# Patient Record
Sex: Female | Born: 1945 | Race: White | Hispanic: No | Marital: Married | Smoking: Former smoker
Health system: Southern US, Community
[De-identification: ages and names within clinical notes are randomized; demographics above are authoritative.]

## PROBLEM LIST (undated history)

## (undated) DIAGNOSIS — H269 Unspecified cataract: Secondary | ICD-10-CM

## (undated) DIAGNOSIS — M1990 Unspecified osteoarthritis, unspecified site: Secondary | ICD-10-CM

## (undated) HISTORY — DX: Unspecified osteoarthritis, unspecified site: M19.90

## (undated) HISTORY — PX: CATARACT EXTRACTION: SUR2

## (undated) HISTORY — PX: TUBAL LIGATION: SHX77

## (undated) HISTORY — PX: COLONOSCOPY: SHX174

## (undated) HISTORY — DX: Unspecified cataract: H26.9

---

## 1983-07-15 HISTORY — PX: COLECTOMY: SHX59

## 1983-07-15 HISTORY — PX: OTHER SURGICAL HISTORY: SHX169

## 1990-07-14 HISTORY — PX: VAGINAL HYSTERECTOMY: SUR661

## 1997-07-14 HISTORY — PX: CHOLECYSTECTOMY: SHX55

## 2003-09-20 ENCOUNTER — Encounter: Admission: RE | Admit: 2003-09-20 | Discharge: 2003-09-20 | Payer: Self-pay | Admitting: Family Medicine

## 2004-06-04 ENCOUNTER — Ambulatory Visit: Payer: Self-pay | Admitting: Family Medicine

## 2004-07-14 HISTORY — PX: HAND SURGERY: SHX662

## 2004-09-05 ENCOUNTER — Ambulatory Visit: Payer: Self-pay | Admitting: Family Medicine

## 2004-09-26 ENCOUNTER — Ambulatory Visit: Payer: Self-pay | Admitting: Family Medicine

## 2004-10-07 ENCOUNTER — Ambulatory Visit: Payer: Self-pay | Admitting: Family Medicine

## 2004-10-15 ENCOUNTER — Encounter: Admission: RE | Admit: 2004-10-15 | Discharge: 2004-10-15 | Payer: Self-pay | Admitting: Family Medicine

## 2004-11-12 ENCOUNTER — Ambulatory Visit: Payer: Self-pay | Admitting: Gastroenterology

## 2004-11-27 ENCOUNTER — Ambulatory Visit: Payer: Self-pay | Admitting: Gastroenterology

## 2005-01-28 ENCOUNTER — Encounter: Admission: RE | Admit: 2005-01-28 | Discharge: 2005-01-28 | Payer: Self-pay | Admitting: Family Medicine

## 2005-05-12 ENCOUNTER — Ambulatory Visit: Payer: Self-pay | Admitting: Family Medicine

## 2005-07-16 ENCOUNTER — Ambulatory Visit: Payer: Self-pay | Admitting: Family Medicine

## 2005-08-26 ENCOUNTER — Ambulatory Visit: Payer: Self-pay | Admitting: Family Medicine

## 2005-11-03 ENCOUNTER — Encounter: Admission: RE | Admit: 2005-11-03 | Discharge: 2005-11-03 | Payer: Self-pay | Admitting: Family Medicine

## 2005-11-04 ENCOUNTER — Ambulatory Visit: Payer: Self-pay | Admitting: Family Medicine

## 2005-11-11 ENCOUNTER — Ambulatory Visit: Payer: Self-pay | Admitting: Family Medicine

## 2005-12-19 ENCOUNTER — Ambulatory Visit: Payer: Self-pay | Admitting: Family Medicine

## 2006-05-07 ENCOUNTER — Ambulatory Visit: Payer: Self-pay | Admitting: Family Medicine

## 2006-10-26 ENCOUNTER — Ambulatory Visit: Payer: Self-pay | Admitting: Family Medicine

## 2006-11-03 ENCOUNTER — Ambulatory Visit: Payer: Self-pay | Admitting: Family Medicine

## 2006-11-05 ENCOUNTER — Encounter: Admission: RE | Admit: 2006-11-05 | Discharge: 2006-11-05 | Payer: Self-pay | Admitting: Family Medicine

## 2006-11-19 ENCOUNTER — Ambulatory Visit: Payer: Self-pay | Admitting: Family Medicine

## 2006-11-19 LAB — CONVERTED CEMR LAB
ALT: 24 units/L (ref 0–40)
AST: 25 units/L (ref 0–37)
Albumin: 3.9 g/dL (ref 3.5–5.2)
Basophils Absolute: 0 10*3/uL (ref 0.0–0.1)
Calcium: 9.6 mg/dL (ref 8.4–10.5)
Chloride: 111 meq/L (ref 96–112)
Cholesterol: 191 mg/dL (ref 0–200)
Eosinophils Absolute: 0.2 10*3/uL (ref 0.0–0.6)
GFR calc non Af Amer: 60 mL/min
MCHC: 35 g/dL (ref 30.0–36.0)
MCV: 88.8 fL (ref 78.0–100.0)
Platelets: 144 10*3/uL — ABNORMAL LOW (ref 150–400)
RBC: 4.57 M/uL (ref 3.87–5.11)
TSH: 2.45 microintl units/mL (ref 0.35–5.50)
Total CHOL/HDL Ratio: 3.7
Triglycerides: 127 mg/dL (ref 0–149)

## 2006-11-26 ENCOUNTER — Ambulatory Visit: Payer: Self-pay | Admitting: Family Medicine

## 2006-12-10 ENCOUNTER — Ambulatory Visit: Payer: Self-pay | Admitting: *Deleted

## 2007-01-04 DIAGNOSIS — N63 Unspecified lump in unspecified breast: Secondary | ICD-10-CM | POA: Insufficient documentation

## 2007-01-04 DIAGNOSIS — Z8719 Personal history of other diseases of the digestive system: Secondary | ICD-10-CM | POA: Insufficient documentation

## 2007-01-04 DIAGNOSIS — J309 Allergic rhinitis, unspecified: Secondary | ICD-10-CM | POA: Insufficient documentation

## 2007-01-04 DIAGNOSIS — Z9189 Other specified personal risk factors, not elsewhere classified: Secondary | ICD-10-CM | POA: Insufficient documentation

## 2007-01-04 DIAGNOSIS — K219 Gastro-esophageal reflux disease without esophagitis: Secondary | ICD-10-CM | POA: Insufficient documentation

## 2007-04-23 ENCOUNTER — Ambulatory Visit: Payer: Self-pay | Admitting: Family Medicine

## 2007-11-15 ENCOUNTER — Ambulatory Visit: Payer: Self-pay | Admitting: Family Medicine

## 2007-11-15 DIAGNOSIS — E785 Hyperlipidemia, unspecified: Secondary | ICD-10-CM | POA: Insufficient documentation

## 2007-11-15 DIAGNOSIS — L259 Unspecified contact dermatitis, unspecified cause: Secondary | ICD-10-CM | POA: Insufficient documentation

## 2007-11-18 ENCOUNTER — Encounter: Admission: RE | Admit: 2007-11-18 | Discharge: 2007-11-18 | Payer: Self-pay | Admitting: Family Medicine

## 2007-12-13 ENCOUNTER — Ambulatory Visit: Payer: Self-pay | Admitting: Family Medicine

## 2007-12-14 LAB — CONVERTED CEMR LAB
AST: 19 units/L (ref 0–37)
Alkaline Phosphatase: 70 units/L (ref 39–117)
BUN: 17 mg/dL (ref 6–23)
Bilirubin, Direct: 0.1 mg/dL (ref 0.0–0.3)
Chloride: 107 meq/L (ref 96–112)
Eosinophils Absolute: 0.2 10*3/uL (ref 0.0–0.7)
Eosinophils Relative: 3.4 % (ref 0.0–5.0)
GFR calc non Af Amer: 67 mL/min
HDL: 43.8 mg/dL (ref >39.0)
MCV: 91.6 fL (ref 78.0–100.0)
Neutrophils Relative %: 47.2 % (ref 43.0–77.0)
Platelets: 153 10*3/uL (ref 150–400)
Potassium: 4 meq/L (ref 3.5–5.1)
Sodium: 143 meq/L (ref 135–145)
Total Bilirubin: 0.9 mg/dL (ref 0.3–1.2)
Total CHOL/HDL Ratio: 4.5
VLDL: 16 mg/dL (ref 0–40)
WBC: 6.2 10*3/uL (ref 4.5–10.5)

## 2007-12-20 ENCOUNTER — Ambulatory Visit: Payer: Self-pay | Admitting: Family Medicine

## 2007-12-27 ENCOUNTER — Encounter: Payer: Self-pay | Admitting: Family Medicine

## 2007-12-27 ENCOUNTER — Ambulatory Visit: Payer: Self-pay | Admitting: Internal Medicine

## 2008-02-08 ENCOUNTER — Ambulatory Visit: Payer: Self-pay | Admitting: Family Medicine

## 2008-03-17 ENCOUNTER — Ambulatory Visit: Payer: Self-pay | Admitting: Family Medicine

## 2008-03-17 DIAGNOSIS — M25579 Pain in unspecified ankle and joints of unspecified foot: Secondary | ICD-10-CM | POA: Insufficient documentation

## 2008-04-13 ENCOUNTER — Ambulatory Visit: Payer: Self-pay | Admitting: Family Medicine

## 2008-05-10 ENCOUNTER — Ambulatory Visit: Payer: Self-pay | Admitting: Family Medicine

## 2008-05-10 DIAGNOSIS — J209 Acute bronchitis, unspecified: Secondary | ICD-10-CM | POA: Insufficient documentation

## 2008-12-27 ENCOUNTER — Encounter: Admission: RE | Admit: 2008-12-27 | Discharge: 2008-12-27 | Payer: Self-pay | Admitting: Family Medicine

## 2009-07-14 HISTORY — PX: BACK SURGERY: SHX140

## 2009-11-21 ENCOUNTER — Ambulatory Visit (HOSPITAL_COMMUNITY): Admission: RE | Admit: 2009-11-21 | Discharge: 2009-11-22 | Payer: Self-pay | Admitting: Neurological Surgery

## 2010-01-08 ENCOUNTER — Encounter: Admission: RE | Admit: 2010-01-08 | Discharge: 2010-01-08 | Payer: Self-pay | Admitting: Family Medicine

## 2010-10-01 LAB — DIFFERENTIAL
Basophils Absolute: 0 10*3/uL (ref 0.0–0.1)
Eosinophils Absolute: 0 10*3/uL (ref 0.0–0.7)
Eosinophils Relative: 0 % (ref 0–5)
Monocytes Absolute: 0.7 10*3/uL (ref 0.1–1.0)

## 2010-10-01 LAB — CBC
HCT: 43.9 % (ref 36.0–46.0)
Hemoglobin: 15.1 g/dL — ABNORMAL HIGH (ref 12.0–15.0)
MCV: 92.6 fL (ref 78.0–100.0)
Platelets: 181 10*3/uL (ref 150–400)
RDW: 13.5 % (ref 11.5–15.5)

## 2010-10-01 LAB — BASIC METABOLIC PANEL
BUN: 14 mg/dL (ref 6–23)
CO2: 27 mEq/L (ref 19–32)
Chloride: 105 mEq/L (ref 96–112)
Glucose, Bld: 91 mg/dL (ref 70–99)
Potassium: 4 mEq/L (ref 3.5–5.1)

## 2010-10-01 LAB — PROTIME-INR: Prothrombin Time: 13.1 seconds (ref 11.6–15.2)

## 2010-10-01 LAB — SURGICAL PCR SCREEN: MRSA, PCR: NEGATIVE

## 2010-11-26 NOTE — Assessment & Plan Note (Signed)
[facility] OFFICE NOTE   April Vance, April Vance                      MRN:          [id number]  DATE:11/26/2006                            DOB:          [birth year]    This is a 65 year old woman here for a complete physical examination.  She has one main complaint today, and that is a frequent dry cough which  is been bothering her for about two years.  She often chalked it up to  allergies in the past, but has tried [employer] and now zero [employer]-  counter with no improvement.  She does have heartburn, occasionally,  with no difficulty swallowing.  She is never smoked or been around  small.  Interestingly, she does have a strong family history of GE  reflux disease, including both of her parents and a number of her  siblings.  Otherwise, she is doing well.  She had a normal mammogram  last month.  She had a normal colonoscopy in may 2006.   For further details of her past medical history, family history, social  history, habits, etcetera -- refer to her last fiscal note dated Nov 11, 2005.   ALLERGIES:  None.   CURRENT MEDICATIONS:  Multivitamins daily, Zyrtec once a day as needed  and Lasix.  Once a day as needed for her lower extremity edema.   OBJECTIVE:  Height 5 feet 8 inches.  Weight 184, BP one or 2/70, pulse  72 and regular.  In general she appears to be doing well.  Skin is clear.  Eyes clear.  Oropharynx clear.  NECK:  Supple without lymphadenopathy or masses.  LUNGS:  Clear.  CARDIAC:  Rate and rhythm regular without gallops, murmurs, rubs.  Distal pulses are full.  EKG is within normal limits.  BREASTS AND AXILLA:  Clear.  ABDOMEN:  Soft, normal bowel sounds, nontender, no masses.  External genitalia within normal limits.  RECTAL EXAM:  No masses or tenderness.  Stool Hemoccult-negative.  EXTREMITIES:  No clubbing, cyanosis, or edema.  NEUROLOGIC EXAM:  Grossly intact.  She was here for  fasting labs on May  8 -- these were all within normal limits with the exception of her lipid  panel.  HDL was excellent at 51, LDL was slightly elevated at 114, but  this is much improved from 148 a year ago.   ASSESSMENT AND PLAN:  1. Complete physical.  I talked to her about getting regular exercise.  2. Allergies stable with over-the-counter agents.  3. Lower extremity edema, stable.  4. Chronic cough.  I think that this may be due to acid reflux as      below, but we will set her up for a PA and lateral chest x-ray      soon.  5. Gastroesophageal reflux disease.  She will try Prilosec over-the-      counter once daily and      follow up if this is not successful.  6. Hyperlipidemia.  She will continue to watch her diet.     Tera Mater. Clent Ridges, MD  SAF/MedQ  DD: 11/26/2006  DT: 11/26/2006  Job #: [id number]

## 2010-12-04 ENCOUNTER — Other Ambulatory Visit: Payer: Self-pay | Admitting: Family Medicine

## 2010-12-04 DIAGNOSIS — Z1231 Encounter for screening mammogram for malignant neoplasm of breast: Secondary | ICD-10-CM

## 2011-01-21 ENCOUNTER — Ambulatory Visit: Payer: Self-pay

## 2011-01-23 ENCOUNTER — Ambulatory Visit
Admission: RE | Admit: 2011-01-23 | Discharge: 2011-01-23 | Disposition: A | Discharge status: Home / Self Care | Payer: 59 | Source: Ambulatory Visit | Attending: Family Medicine | Admitting: Family Medicine

## 2011-01-23 DIAGNOSIS — Z1231 Encounter for screening mammogram for malignant neoplasm of breast: Secondary | ICD-10-CM

## 2011-06-26 IMAGING — CR DG LUMBAR SPINE 1V
1 series · 1 of 1 positions shown · non-contrast
Comparison: None

CLINICAL DATA: Right L5-S1 microdiskectomy

LUMBAR SPINE - 1 VIEW

[view not recorded]
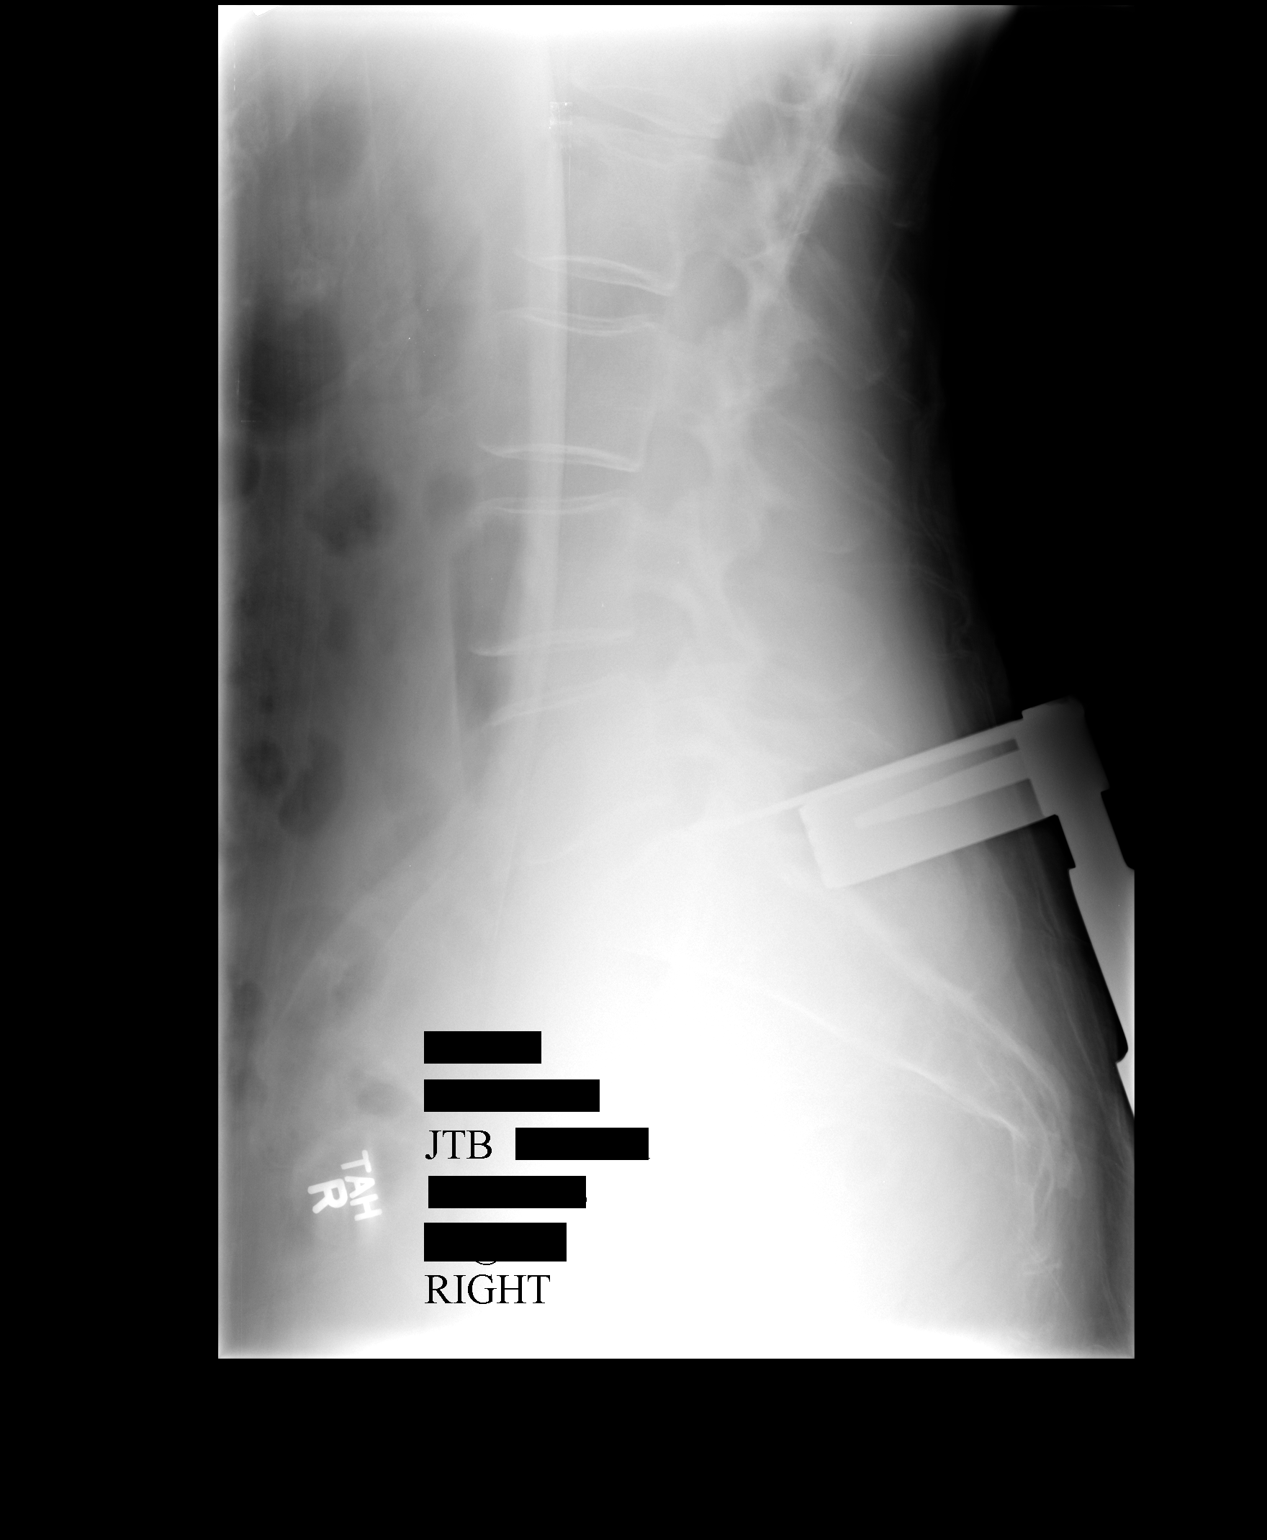

[1 of 1 positions shown; findings below may reference images not displayed]

FINDINGS: The single view shows tissue spreaders posteriorly with a
probe directed at the L5-S1 disc level.
IMPRESSION: L5-S1 localized.

## 2012-03-22 ENCOUNTER — Other Ambulatory Visit: Payer: Self-pay | Admitting: Family Medicine

## 2012-03-22 DIAGNOSIS — Z1231 Encounter for screening mammogram for malignant neoplasm of breast: Secondary | ICD-10-CM

## 2012-04-07 ENCOUNTER — Ambulatory Visit
Admission: RE | Admit: 2012-04-07 | Discharge: 2012-04-07 | Disposition: A | Discharge status: Home / Self Care | Payer: 59 | Source: Ambulatory Visit | Attending: Family Medicine | Admitting: Family Medicine

## 2012-04-07 DIAGNOSIS — Z1231 Encounter for screening mammogram for malignant neoplasm of breast: Secondary | ICD-10-CM

## 2013-03-16 ENCOUNTER — Other Ambulatory Visit: Payer: Self-pay

## 2013-03-16 DIAGNOSIS — Z1231 Encounter for screening mammogram for malignant neoplasm of breast: Secondary | ICD-10-CM

## 2013-04-12 ENCOUNTER — Ambulatory Visit: Payer: 59

## 2013-04-12 ENCOUNTER — Ambulatory Visit
Admission: RE | Admit: 2013-04-12 | Discharge: 2013-04-12 | Disposition: A | Discharge status: Home / Self Care | Payer: 59 | Source: Ambulatory Visit

## 2013-04-12 DIAGNOSIS — Z1231 Encounter for screening mammogram for malignant neoplasm of breast: Secondary | ICD-10-CM

## 2013-05-04 ENCOUNTER — Ambulatory Visit
Admission: RE | Admit: 2013-05-04 | Discharge: 2013-05-04 | Disposition: A | Discharge status: Home / Self Care | Payer: Medicare Other | Source: Ambulatory Visit | Attending: Family Medicine | Admitting: Family Medicine

## 2013-05-04 ENCOUNTER — Other Ambulatory Visit: Payer: Self-pay | Admitting: Family Medicine

## 2013-05-04 DIAGNOSIS — R0781 Pleurodynia: Secondary | ICD-10-CM

## 2014-03-16 ENCOUNTER — Other Ambulatory Visit: Payer: Self-pay

## 2014-03-16 DIAGNOSIS — Z1231 Encounter for screening mammogram for malignant neoplasm of breast: Secondary | ICD-10-CM

## 2014-04-17 ENCOUNTER — Ambulatory Visit
Admission: RE | Admit: 2014-04-17 | Discharge: 2014-04-17 | Disposition: A | Discharge status: Home / Self Care | Payer: Medicare Other | Source: Ambulatory Visit

## 2014-04-17 DIAGNOSIS — Z1231 Encounter for screening mammogram for malignant neoplasm of breast: Secondary | ICD-10-CM

## 2014-05-08 ENCOUNTER — Ambulatory Visit
Admission: RE | Admit: 2014-05-08 | Discharge: 2014-05-08 | Disposition: A | Discharge status: Home / Self Care | Payer: Medicare Other | Source: Ambulatory Visit | Attending: Family Medicine | Admitting: Family Medicine

## 2014-05-08 ENCOUNTER — Other Ambulatory Visit: Payer: Self-pay | Admitting: Family Medicine

## 2014-05-08 DIAGNOSIS — J209 Acute bronchitis, unspecified: Secondary | ICD-10-CM

## 2014-09-14 ENCOUNTER — Encounter: Payer: Self-pay | Admitting: Gastroenterology

## 2014-10-24 ENCOUNTER — Encounter: Payer: Self-pay | Admitting: Internal Medicine

## 2014-12-06 ENCOUNTER — Ambulatory Visit (AMBULATORY_SURGERY_CENTER): Payer: Self-pay

## 2014-12-06 VITALS — Ht 68.0 in | Wt 175.0 lb

## 2014-12-06 DIAGNOSIS — Z8371 Family history of colonic polyps: Secondary | ICD-10-CM

## 2014-12-06 DIAGNOSIS — Z83719 Family history of colon polyps, unspecified: Secondary | ICD-10-CM

## 2014-12-06 NOTE — Progress Notes (Signed)
No allergies to eggs or soy No home oxygen No diet/weight loss meds No past problems with anesthesia X PONV with general  Has email  Emmi instructions given for colonoscopy

## 2014-12-07 IMAGING — CR DG RIBS 2V*R*
2 series · 2 of 2 positions shown · non-contrast
Comparison: Chest radiography 11/16/2009

CLINICAL DATA: Sharp pain of the right lower ribcage.

EXAM:
RIGHT RIBS - 2 VIEW

[view not recorded (1 of 2)]
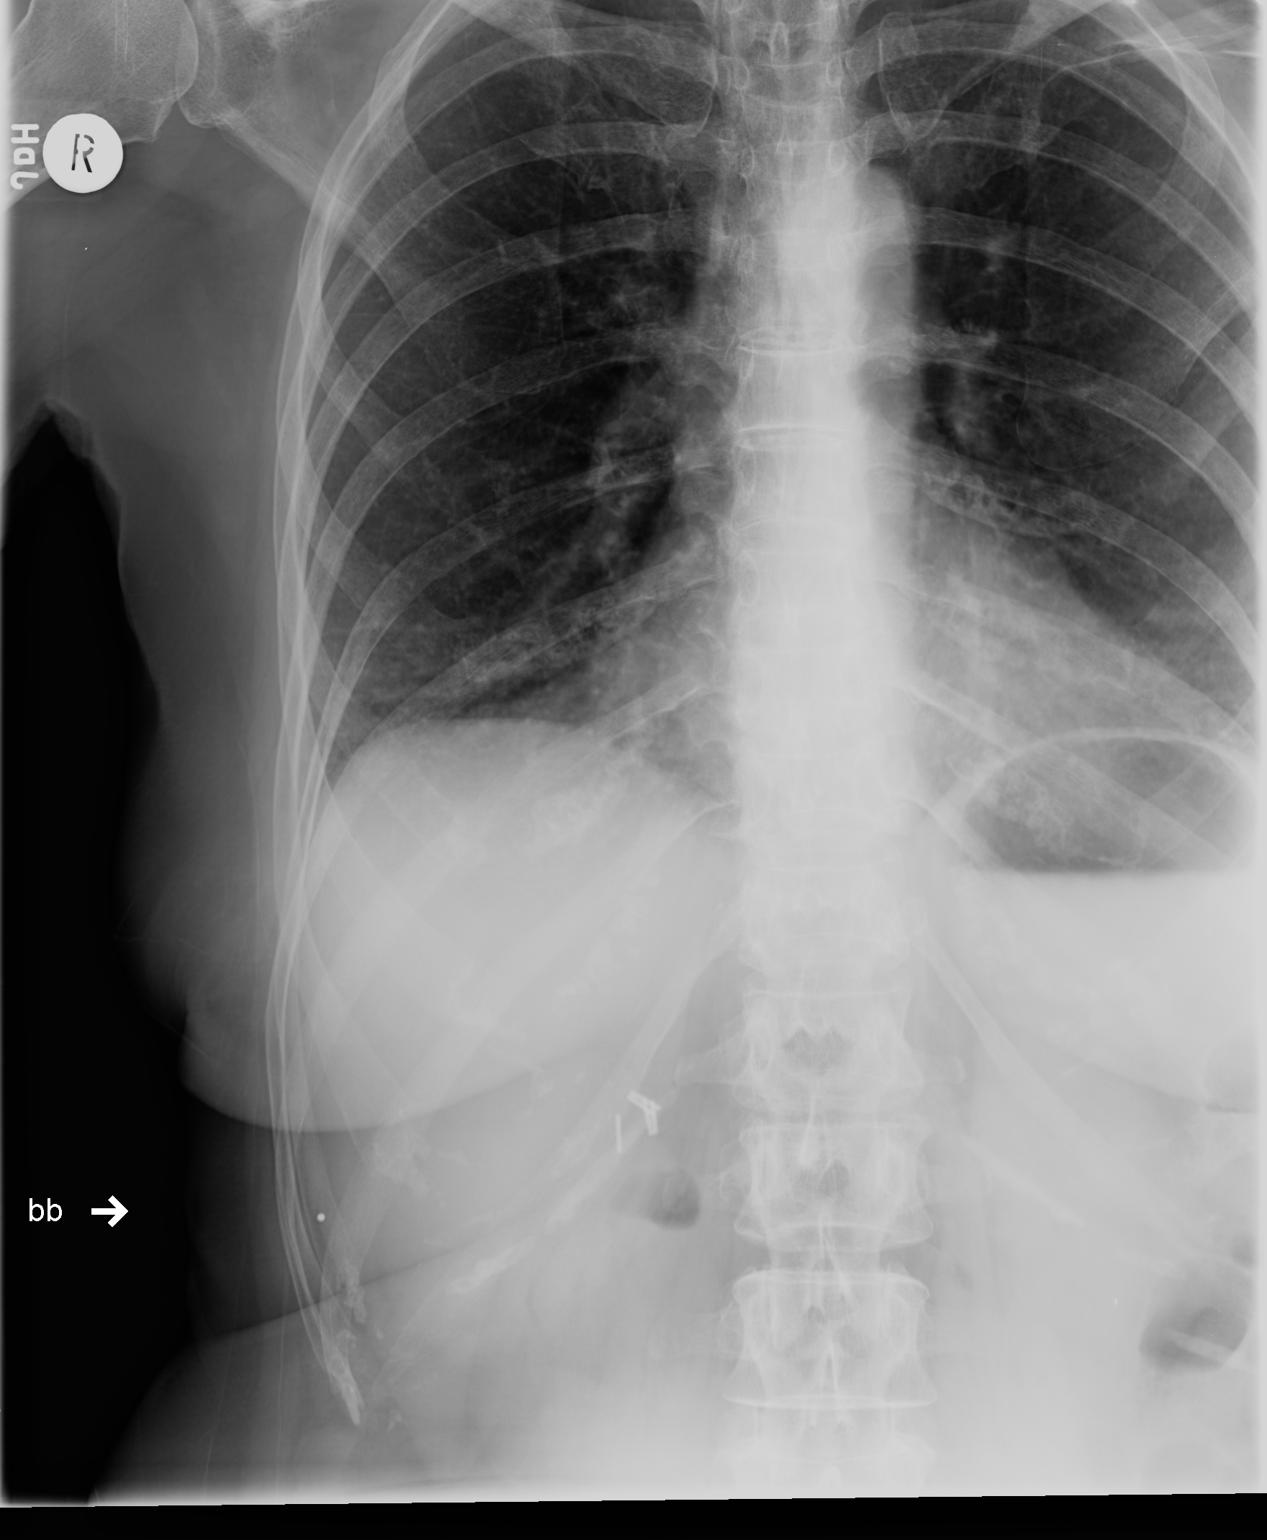

[view not recorded (2 of 2)]
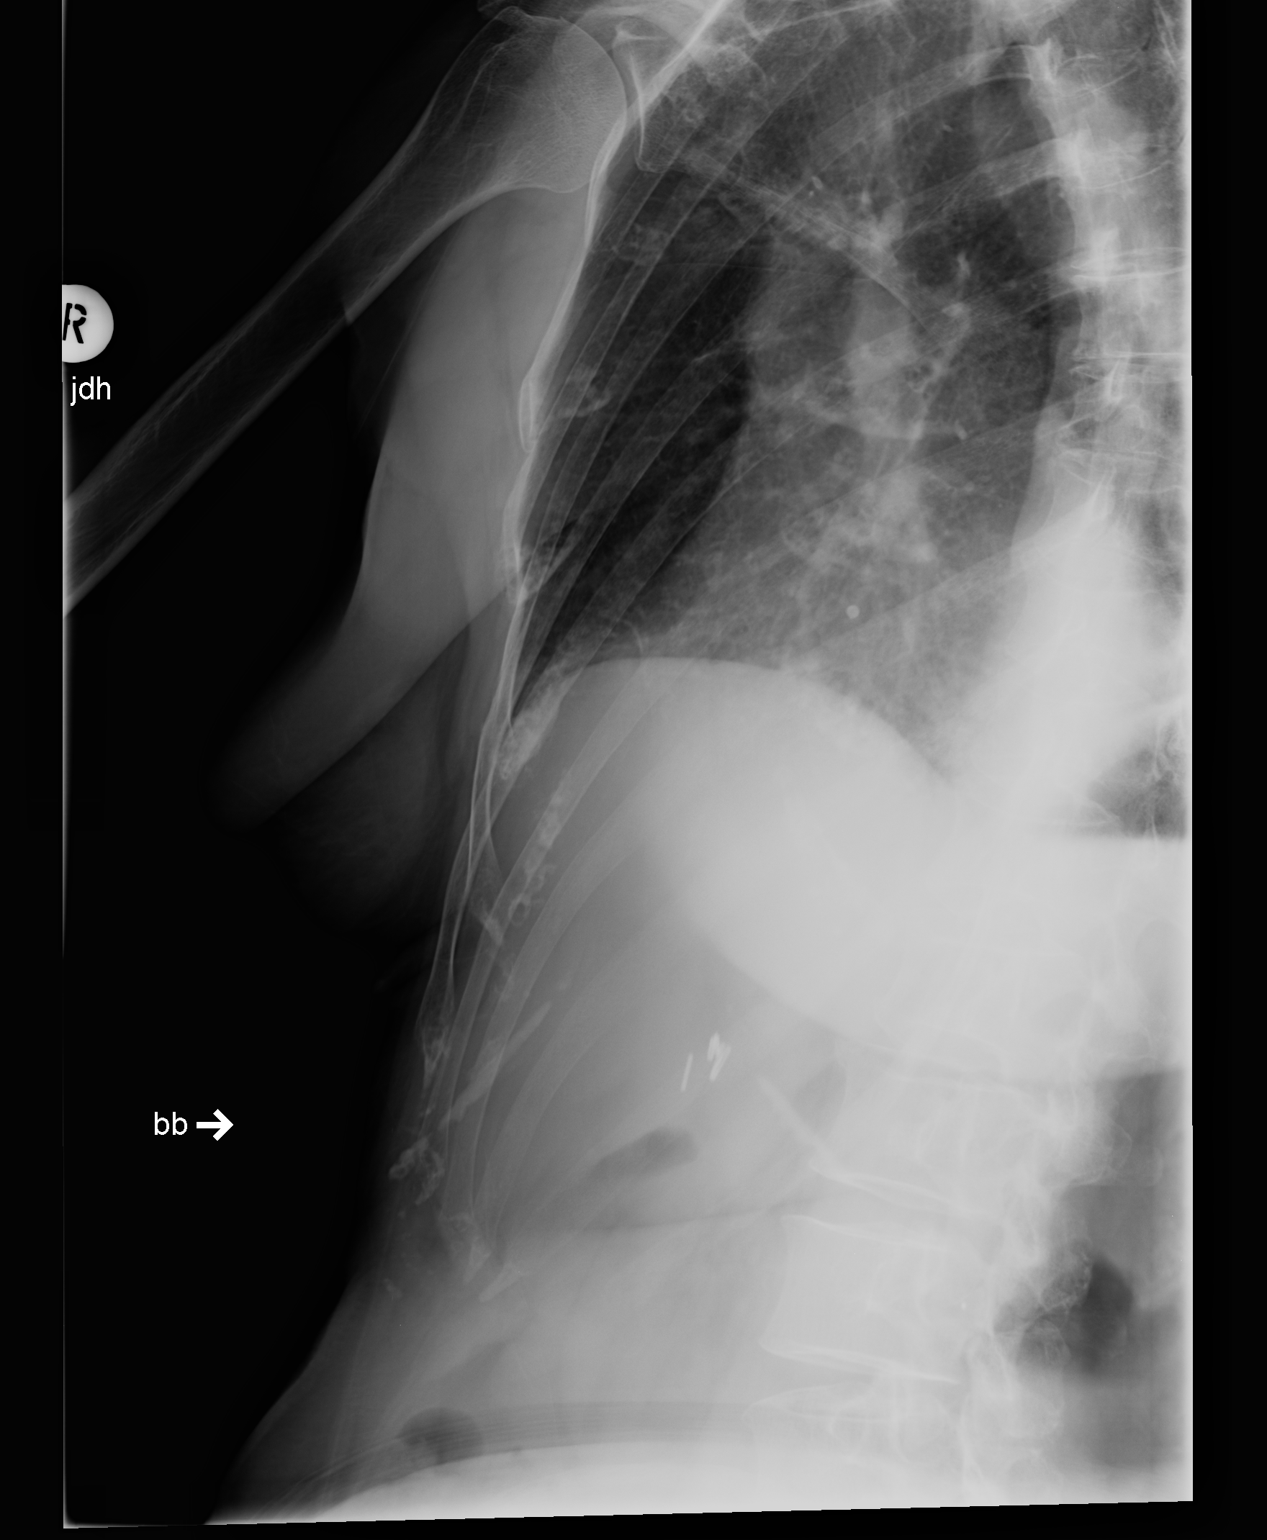

[2 of 2 positions shown; findings below may reference images not displayed]

FINDINGS: No intrathoracic abnormality is seen. There are clips in the right
upper quadrant follicles to ectomy. Skin marker in place in the
region of concern overlies the lower lateral right ribs. No rib
abnormality is seen in that region.
IMPRESSION: No bony rib finding.

## 2014-12-20 ENCOUNTER — Encounter: Payer: Self-pay | Admitting: Internal Medicine

## 2014-12-20 ENCOUNTER — Ambulatory Visit (AMBULATORY_SURGERY_CENTER): Payer: Medicare Other | Admitting: Internal Medicine

## 2014-12-20 VITALS — BP 108/65 | HR 58 | Temp 98.0°F | Resp 16 | Ht 68.0 in | Wt 175.0 lb

## 2014-12-20 DIAGNOSIS — Z1211 Encounter for screening for malignant neoplasm of colon: Secondary | ICD-10-CM

## 2014-12-20 DIAGNOSIS — D123 Benign neoplasm of transverse colon: Secondary | ICD-10-CM | POA: Diagnosis not present

## 2014-12-20 DIAGNOSIS — Z8371 Family history of colonic polyps: Secondary | ICD-10-CM

## 2014-12-20 MED ORDER — SODIUM CHLORIDE 0.9 % IV SOLN: 500.0000 mL | INTRAVENOUS | Status: DC

## 2014-12-20 NOTE — Progress Notes (Signed)
Called to room to assist during endoscopic procedure.  Patient ID and intended procedure confirmed with present staff. Received instructions for my participation in the procedure from the performing physician.  

## 2014-12-20 NOTE — Op Note (Signed)
[place]  [address] [place], [id number]   COLONOSCOPY PROCEDURE REPORT  PATIENT: April Vance, April Vance  MR#: [id number] BIRTHDATE: [birth year] , 75  yrs. old GENDER: female ENDOSCOPIST: Eustace Quail, MD REFERRED VV:OHYWVPXTG Recall, M.D. PROCEDURE DATE:  12/20/2014 PROCEDURE:   Colonoscopy, screening and Colonoscopy with snare polypectomy x 1 First Screening Colonoscopy - Avg.  risk and is 50 yrs.  old or older - No.  Prior Negative Screening - Now for repeat screening. 10 or more years since last screening  History of Adenoma - Now for follow-up colonoscopy & has been > or = to 3 yrs.  N/A  Polyps removed today? Yes ASA CLASS:   Class I INDICATIONS:Screening for colonic neoplasia and Colorectal Neoplasm Risk Assessment for this procedure is average risk. Previous screening examination May 2006 negative for neoplasia. MEDICATIONS: Monitored anesthesia care and Propofol 200 mg IV  DESCRIPTION OF PROCEDURE:   After the risks benefits and alternatives of the procedure were thoroughly explained, informed consent was obtained.  The digital rectal exam revealed no abnormalities of the rectum.   The LB [id number]  endoscope was introduced through the anus and advanced to the surgical anastomosis. No adverse events experienced.   The quality of the prep was excellent.  (MiraLax was used)  The instrument was then slowly withdrawn as the colon was fully examined. Estimated blood loss is zero unless otherwise noted in this procedure report.      COLON FINDINGS: There was moderate diverticulosis scattered throughout the entire examined colon. There was evidence of prior right hemicolectomy with unremarkable anastomosis. A small transverse colon polyp was removed with cold snare, retrieved, and submitted to pathology..   The examination was otherwise normal. Retroflexed views revealed internal hemorrhoids. The time to cecum = 2.8 Withdrawal time = 9.2   The  scope was withdrawn and the procedure completed.  COMPLICATIONS: There were no immediate complications.  ENDOSCOPIC IMPRESSION: 1.   Diminutive transverse colon polyp removed with cold snare 2.   Moderate diverticulosis 3. Status post prior right hemicolectomy 4.   The examination was otherwise normal  RECOMMENDATIONS: 1. Repeat colonoscopy in 5 years if polyp adenomatous; otherwise 10 years  eSigned:  Eustace Quail, MD 12/20/2014 12:01 PM   cc: Carol Ada, MD and The Patient

## 2014-12-20 NOTE — Patient Instructions (Addendum)
YOU HAD AN ENDOSCOPIC PROCEDURE TODAY AT [facility]:   Refer to the procedure report that was given to you for any specific questions about what was found during the examination.  If the procedure report does not answer your questions, please call your gastroenterologist to clarify.  If you requested that your care partner not be given the details of your procedure findings, then the procedure report has been included in a sealed envelope for you to review at your convenience later.  YOU SHOULD EXPECT: Some feelings of bloating in the abdomen. Passage of more gas than usual.  Walking can help get rid of the air that was put into your GI tract during the procedure and reduce the bloating. If you had a lower endoscopy (such as a colonoscopy or flexible sigmoidoscopy) you may notice spotting of blood in your stool or on the toilet paper. If you underwent a bowel prep for your procedure, you may not have a normal bowel movement for a few days.  Please Note:  You might notice some irritation and congestion in your nose or some drainage.  This is from the oxygen used during your procedure.  There is no need for concern and it should clear up in a day or so.  SYMPTOMS TO REPORT IMMEDIATELY:   Following lower endoscopy (colonoscopy or flexible sigmoidoscopy):  Excessive amounts of blood in the stool  Significant tenderness or worsening of abdominal pains  Swelling of the abdomen that is new, acute  Fever of 100F or higher   For urgent or emergent issues, a gastroenterologist can be reached at any hour by calling [phone].   DIET: Your first meal following the procedure should be a small meal and then it is ok to progress to your normal diet. Heavy or fried foods are harder to digest and may make you feel nauseous or bloated.  Likewise, meals heavy in dairy and vegetables can increase bloating.  Drink plenty of fluids but you should avoid alcoholic beverages for 24  hours.  ACTIVITY:  You should plan to take it easy for the rest of today and you should NOT DRIVE or use heavy machinery until tomorrow (because of the sedation medicines used during the test).    FOLLOW UP: Our staff will call the number listed on your records the next business day following your procedure to check on you and address any questions or concerns that you may have regarding the information given to you following your procedure. If we do not reach you, we will leave a message.  However, if you are feeling well and you are not experiencing any problems, there is no need to return our call.  We will assume that you have returned to your regular daily activities without incident.  If any biopsies were taken you will be contacted by phone or by letter within the next 1-3 weeks.  Please call us at [phone] if you have not heard about the biopsies in 3 weeks.    SIGNATURES/CONFIDENTIALITY: You and/or your care partner have signed paperwork which will be entered into your electronic medical record.  These signatures attest to the fact that that the information above on your After Visit Summary has been reviewed and is understood.  Full responsibility of the confidentiality of this discharge information lies with you and/or your care-partner.  Polyp information given. diverticlosis and high fiber diet information given.

## 2014-12-20 NOTE — Progress Notes (Signed)
Report to PACU, RN, vss, BBS= Clear.  

## 2014-12-21 ENCOUNTER — Telehealth: Payer: Self-pay | Admitting: *Deleted

## 2014-12-21 NOTE — Telephone Encounter (Signed)
  Follow up Call-  Call back number 12/20/2014  Post procedure Call Back phone  # [phone]  Permission to leave phone message Yes     Patient questions:  Do you have a fever, pain , or abdominal swelling? No. Pain Score  0 *  Have you tolerated food without any problems? Yes.    Have you been able to return to your normal activities? Yes.    Do you have any questions about your discharge instructions: Diet   No. Medications  No. Follow up visit  No.  Do you have questions or concerns about your Care? No.  Actions: * If pain score is 4 or above: No action needed, pain <4.

## 2014-12-26 ENCOUNTER — Encounter: Payer: Self-pay | Admitting: Internal Medicine

## 2019-12-01 ENCOUNTER — Encounter: Payer: Self-pay | Admitting: Gastroenterology
